# Patient Record
Sex: Female | Born: 1935 | Race: White | Hispanic: No | Marital: Married | Smoking: Never smoker
Health system: Southern US, Community
[De-identification: ages and names within clinical notes are randomized; demographics above are authoritative.]

## PROBLEM LIST (undated history)

## (undated) DIAGNOSIS — H9190 Unspecified hearing loss, unspecified ear: Secondary | ICD-10-CM

## (undated) DIAGNOSIS — M898X9 Other specified disorders of bone, unspecified site: Secondary | ICD-10-CM

## (undated) DIAGNOSIS — J449 Chronic obstructive pulmonary disease, unspecified: Secondary | ICD-10-CM

## (undated) DIAGNOSIS — E119 Type 2 diabetes mellitus without complications: Secondary | ICD-10-CM

## (undated) HISTORY — DX: Chronic obstructive pulmonary disease, unspecified: J44.9

## (undated) HISTORY — DX: Other specified disorders of bone, unspecified site: M89.8X9

## (undated) HISTORY — DX: Type 2 diabetes mellitus without complications: E11.9

## (undated) HISTORY — DX: Unspecified hearing loss, unspecified ear: H91.90

## (undated) HISTORY — PX: CARDIAC SURGERY: SHX584

## (undated) HISTORY — PX: VAGINAL HYSTERECTOMY: SUR661

## (undated) HISTORY — PX: GALLBLADDER SURGERY: SHX652

---

## 2004-10-06 ENCOUNTER — Ambulatory Visit: Payer: Self-pay | Admitting: Rheumatology

## 2005-04-12 ENCOUNTER — Ambulatory Visit: Payer: Self-pay | Admitting: Family Medicine

## 2005-04-18 ENCOUNTER — Ambulatory Visit: Payer: Self-pay | Admitting: Family Medicine

## 2005-08-22 ENCOUNTER — Ambulatory Visit: Payer: Self-pay | Admitting: Pain Medicine

## 2005-09-03 ENCOUNTER — Ambulatory Visit: Payer: Self-pay | Admitting: Pain Medicine

## 2005-09-04 ENCOUNTER — Ambulatory Visit: Payer: Self-pay | Admitting: Pain Medicine

## 2005-09-19 ENCOUNTER — Ambulatory Visit: Payer: Self-pay | Admitting: Physician Assistant

## 2005-10-02 ENCOUNTER — Ambulatory Visit: Payer: Self-pay | Admitting: Pain Medicine

## 2005-10-09 ENCOUNTER — Ambulatory Visit: Payer: Self-pay | Admitting: Family Medicine

## 2005-10-16 ENCOUNTER — Ambulatory Visit: Payer: Self-pay | Admitting: Pain Medicine

## 2005-10-23 ENCOUNTER — Ambulatory Visit: Payer: Self-pay | Admitting: Family Medicine

## 2005-10-30 ENCOUNTER — Ambulatory Visit: Payer: Self-pay | Admitting: Physician Assistant

## 2005-11-26 ENCOUNTER — Ambulatory Visit: Payer: Self-pay | Admitting: Physician Assistant

## 2005-11-27 ENCOUNTER — Ambulatory Visit: Payer: Self-pay | Admitting: Pain Medicine

## 2005-12-12 ENCOUNTER — Ambulatory Visit: Payer: Self-pay | Admitting: Physician Assistant

## 2006-01-07 ENCOUNTER — Ambulatory Visit: Payer: Self-pay | Admitting: Surgery

## 2006-01-07 ENCOUNTER — Other Ambulatory Visit: Payer: Self-pay

## 2006-01-15 ENCOUNTER — Inpatient Hospital Stay: Payer: Self-pay | Admitting: Surgery

## 2006-01-21 ENCOUNTER — Inpatient Hospital Stay: Payer: Self-pay | Admitting: Surgery

## 2006-03-22 ENCOUNTER — Emergency Department: Payer: Self-pay | Admitting: Unknown Physician Specialty

## 2006-03-22 ENCOUNTER — Other Ambulatory Visit: Payer: Self-pay

## 2006-04-30 ENCOUNTER — Ambulatory Visit: Payer: Self-pay | Admitting: Unknown Physician Specialty

## 2006-05-30 ENCOUNTER — Ambulatory Visit: Payer: Self-pay | Admitting: Unknown Physician Specialty

## 2007-06-23 ENCOUNTER — Ambulatory Visit: Payer: Self-pay | Admitting: Pain Medicine

## 2007-07-08 ENCOUNTER — Ambulatory Visit: Payer: Self-pay | Admitting: Pain Medicine

## 2007-07-21 ENCOUNTER — Other Ambulatory Visit: Payer: Self-pay

## 2007-07-21 ENCOUNTER — Emergency Department: Payer: Self-pay | Admitting: Emergency Medicine

## 2007-07-28 ENCOUNTER — Ambulatory Visit: Payer: Self-pay | Admitting: Physician Assistant

## 2008-03-11 ENCOUNTER — Ambulatory Visit: Payer: Self-pay | Admitting: Family Medicine

## 2008-12-17 ENCOUNTER — Ambulatory Visit: Payer: Self-pay | Admitting: Specialist

## 2009-08-24 ENCOUNTER — Ambulatory Visit: Payer: Self-pay | Admitting: Family Medicine

## 2010-02-22 ENCOUNTER — Ambulatory Visit: Payer: Self-pay | Admitting: Unknown Physician Specialty

## 2010-03-19 ENCOUNTER — Ambulatory Visit: Payer: Self-pay | Admitting: Unknown Physician Specialty

## 2010-08-21 ENCOUNTER — Ambulatory Visit: Payer: Self-pay | Admitting: Specialist

## 2011-10-24 ENCOUNTER — Ambulatory Visit: Payer: Self-pay | Admitting: Specialist

## 2011-12-25 ENCOUNTER — Emergency Department: Payer: Self-pay | Admitting: Emergency Medicine

## 2011-12-25 LAB — CBC
HCT: 35.9 % (ref 35.0–47.0)
HGB: 12.2 g/dL (ref 12.0–16.0)
MCH: 32.6 pg (ref 26.0–34.0)
MCHC: 34 g/dL (ref 32.0–36.0)
MCV: 96 fL (ref 80–100)
WBC: 16.2 10*3/uL — ABNORMAL HIGH (ref 3.6–11.0)

## 2011-12-25 LAB — COMPREHENSIVE METABOLIC PANEL
Bilirubin,Total: 0.4 mg/dL (ref 0.2–1.0)
Calcium, Total: 9.5 mg/dL (ref 8.5–10.1)
Chloride: 100 mmol/L (ref 98–107)
Co2: 24 mmol/L (ref 21–32)
Creatinine: 1.09 mg/dL (ref 0.60–1.30)
EGFR (African American): >60
EGFR (Non-African Amer.): 50 — ABNORMAL LOW
Osmolality: 282 (ref 275–301)
SGPT (ALT): 15 U/L
Sodium: 137 mmol/L (ref 136–145)

## 2011-12-25 LAB — URINALYSIS, COMPLETE
Bilirubin,UR: NEGATIVE
Nitrite: NEGATIVE
Ph: 5 (ref 4.5–8.0)
RBC,UR: 7 /HPF (ref 0–5)
Squamous Epithelial: 8
Transitional Epi: <1

## 2011-12-25 LAB — TROPONIN I: Troponin-I: <0.02 ng/mL

## 2013-04-02 ENCOUNTER — Encounter: Payer: Self-pay | Admitting: Cardiothoracic Surgery

## 2013-04-02 ENCOUNTER — Encounter: Payer: Self-pay | Admitting: Nurse Practitioner

## 2013-11-05 ENCOUNTER — Ambulatory Visit: Payer: Self-pay | Admitting: Podiatrist

## 2013-12-03 ENCOUNTER — Encounter: Payer: Self-pay | Admitting: Podiatrist

## 2013-12-03 ENCOUNTER — Ambulatory Visit (INDEPENDENT_AMBULATORY_CARE_PROVIDER_SITE_OTHER): Payer: 59 | Admitting: Podiatrist

## 2013-12-03 VITALS — BP 119/62 | HR 90 | Resp 16 | Ht 63.0 in | Wt 130.0 lb

## 2013-12-03 DIAGNOSIS — L603 Nail dystrophy: Secondary | ICD-10-CM

## 2013-12-03 DIAGNOSIS — L608 Other nail disorders: Secondary | ICD-10-CM

## 2013-12-03 NOTE — Patient Instructions (Signed)

## 2013-12-03 NOTE — Progress Notes (Signed)
Subjective: Joyce RhodesBetty presents today with her daughter for routine foot care. She denies any pain in her feet. She denies any problems or concerns with her feet at today's visit. Objective: Pedal pulses are palpable and strong at 2/4 DP bilateral and 1/4 PT bilateral. Capillary refill time is within normal limits, baseline multiple varicosities are present. Moderate swelling is present bilateral legs. Neurological sensation is intact via Semmes Weinstein monofilament at 5 out of 5 sites bilateral. Light touch is intact as well as vibratory sensation. Rectus foot type is seen with fat pad atrophy present bilateral. No open lesions, no interdigital maceration, no interdigital skin lesions are present. No pre-ulcerative or hyperkeratotic lesions are noted. The patient's toenails are elongated and and asymptomatic at today's visit.  Assessment: Elongated toenails bilateral feet Plan: Debridement of all 10 toenails was carried out at today's visit without complication. She'll be seen back in 3 months or per her request for continued care.

## 2014-03-04 ENCOUNTER — Ambulatory Visit (INDEPENDENT_AMBULATORY_CARE_PROVIDER_SITE_OTHER): Payer: 59 | Admitting: Podiatrist

## 2014-03-04 VITALS — Resp 16 | Ht 63.0 in | Wt 134.0 lb

## 2014-03-04 DIAGNOSIS — L600 Ingrowing nail: Secondary | ICD-10-CM

## 2014-03-04 DIAGNOSIS — M79609 Pain in unspecified limb: Secondary | ICD-10-CM

## 2014-03-04 DIAGNOSIS — L603 Nail dystrophy: Secondary | ICD-10-CM

## 2014-03-04 NOTE — Progress Notes (Signed)
Subjective: Joyce Tucker presents today with her daughter for routine foot care. She denies any pain in her feet. She has been swelling more and more in her legs recently. In the past they have tried compression hose however they are not comfortable. At this time she prefers elevation.  Objective: Pedal pulses are palpable and strong at 2/4 DP bilateral and 1/4 PT bilateral. Capillary refill time is within normal limits, baseline multiple varicosities are present. worsening swelling is present bilateral legs. Neurological sensation is intact via Semmes Weinstein monofilament at 5 out of 5 sites bilateral. Light touch is intact as well as vibratory sensation. Rectus foot type is seen with fat pad atrophy present bilateral. No open lesions, no interdigital maceration, no interdigital skin lesions are present. No pre-ulcerative or hyperkeratotic lesions are noted. The patient's toenails are elongated and and symptomatic at today's visit.  Assessment: Elongated toenails bilateral feet , symptomatic onychomycosis  Plan: Debridement of all 10 toenails was carried out at today's visit without complication. She'll be seen back in 3 months or per her request for continued care.

## 2014-06-10 ENCOUNTER — Ambulatory Visit (INDEPENDENT_AMBULATORY_CARE_PROVIDER_SITE_OTHER): Payer: 59 | Admitting: Podiatrist

## 2014-06-10 ENCOUNTER — Encounter: Payer: Self-pay | Admitting: Podiatrist

## 2014-06-10 DIAGNOSIS — M79609 Pain in unspecified limb: Secondary | ICD-10-CM

## 2014-06-10 DIAGNOSIS — M79676 Pain in unspecified toe(s): Principal | ICD-10-CM

## 2014-06-10 DIAGNOSIS — B351 Tinea unguium: Secondary | ICD-10-CM

## 2014-06-10 NOTE — Progress Notes (Signed)
Subjective: Joyce Tucker presents today with her daughter for routine foot care. She denies any pain in her feet. She has been swelling more and more in her legs recently. In the past they have tried compression hose however they are not comfortable. At this time she prefers elevation.  Objective: Pedal pulses are palpable and strong at 2/4 DP bilateral and 1/4 PT bilateral. Capillary refill time is within normal limits, baseline multiple varicosities are present. worsening swelling is present bilateral legs. Neurological sensation is intact via Semmes Weinstein monofilament at 5 out of 5 sites bilateral. Light touch is intact as well as vibratory sensation. Rectus foot type is seen with fat pad atrophy present bilateral. No open lesions, no interdigital maceration, no interdigital skin lesions are present. No pre-ulcerative or hyperkeratotic lesions are noted. The patient's toenails are elongated and and symptomatic at today's visit.  Assessment: Elongated toenails bilateral feet , symptomatic onychomycosis  Plan: Debridement of all 10 toenails was carried out at today's visit without complication. She'll be seen back in 3 months or per her request for continued care.        

## 2014-08-19 ENCOUNTER — Ambulatory Visit: Payer: Self-pay | Admitting: Internal Medicine

## 2014-09-03 ENCOUNTER — Emergency Department: Payer: Self-pay | Admitting: Emergency Medicine

## 2014-09-03 LAB — COMPREHENSIVE METABOLIC PANEL
ALBUMIN: 2.6 g/dL — AB (ref 3.4–5.0)
ALK PHOS: 87 U/L
Anion Gap: 13 (ref 7–16)
BILIRUBIN TOTAL: 0.6 mg/dL (ref 0.2–1.0)
BUN: 44 mg/dL — ABNORMAL HIGH (ref 7–18)
CALCIUM: 8.6 mg/dL (ref 8.5–10.1)
CHLORIDE: 99 mmol/L (ref 98–107)
Co2: 20 mmol/L — ABNORMAL LOW (ref 21–32)
Creatinine: 1.59 mg/dL — ABNORMAL HIGH (ref 0.60–1.30)
EGFR (African American): 39 — ABNORMAL LOW
EGFR (Non-African Amer.): 32 — ABNORMAL LOW
Glucose: 341 mg/dL — ABNORMAL HIGH (ref 65–99)
Osmolality: 289 (ref 275–301)
Potassium: 5.8 mmol/L — ABNORMAL HIGH (ref 3.5–5.1)
SGOT(AST): 38 U/L — ABNORMAL HIGH (ref 15–37)
SGPT (ALT): 41 U/L
Sodium: 132 mmol/L — ABNORMAL LOW (ref 136–145)
Total Protein: 6.9 g/dL (ref 6.4–8.2)

## 2014-09-03 LAB — CBC WITH DIFFERENTIAL/PLATELET
BASOS PCT: 0.3 %
Basophil #: 0 10*3/uL (ref 0.0–0.1)
EOS PCT: 0 %
Eosinophil #: 0 10*3/uL (ref 0.0–0.7)
HCT: 39.6 % (ref 35.0–47.0)
HGB: 12.3 g/dL (ref 12.0–16.0)
LYMPHS ABS: 1.4 10*3/uL (ref 1.0–3.6)
LYMPHS PCT: 10 %
MCH: 31.9 pg (ref 26.0–34.0)
MCHC: 31.2 g/dL — ABNORMAL LOW (ref 32.0–36.0)
MCV: 102 fL — ABNORMAL HIGH (ref 80–100)
Monocyte #: 0.9 x10 3/mm (ref 0.2–0.9)
Monocyte %: 6.3 %
NEUTROS ABS: 11.5 10*3/uL — AB (ref 1.4–6.5)
Neutrophil %: 83.4 %
Platelet: 328 10*3/uL (ref 150–440)
RBC: 3.87 10*6/uL (ref 3.80–5.20)
RDW: 16.4 % — AB (ref 11.5–14.5)
WBC: 13.8 10*3/uL — AB (ref 3.6–11.0)

## 2014-09-03 LAB — PROTIME-INR
INR: 1.3
Prothrombin Time: 15.5 secs — ABNORMAL HIGH (ref 11.5–14.7)

## 2014-09-03 LAB — TROPONIN I: Troponin-I: 0.03 ng/mL

## 2014-09-03 LAB — PRO B NATRIURETIC PEPTIDE: B-Type Natriuretic Peptide: 75315 pg/mL — ABNORMAL HIGH (ref 0–450)

## 2014-09-03 LAB — CK TOTAL AND CKMB (NOT AT ARMC)
CK, Total: 74 U/L
CK-MB: 1.2 ng/mL (ref 0.5–3.6)

## 2014-09-09 ENCOUNTER — Ambulatory Visit: Payer: 59 | Admitting: Podiatrist

## 2014-09-09 ENCOUNTER — Ambulatory Visit: Payer: 59 | Admitting: Podiatry

## 2014-09-19 ENCOUNTER — Ambulatory Visit: Payer: Self-pay | Admitting: Internal Medicine

## 2014-09-19 DEATH — deceased

## 2015-05-22 IMAGING — CR DG CHEST 1V PORT
1 series · 1 of 1 positions shown · non-contrast
Comparison: One-view chest 12/25/2011.

CLINICAL DATA: Shortness of breath and rapid heart rate. Supra
ventricular tachycardia treated with the date is seen. Chest pain.

EXAM:
PORTABLE CHEST - 1 VIEW

[ap]
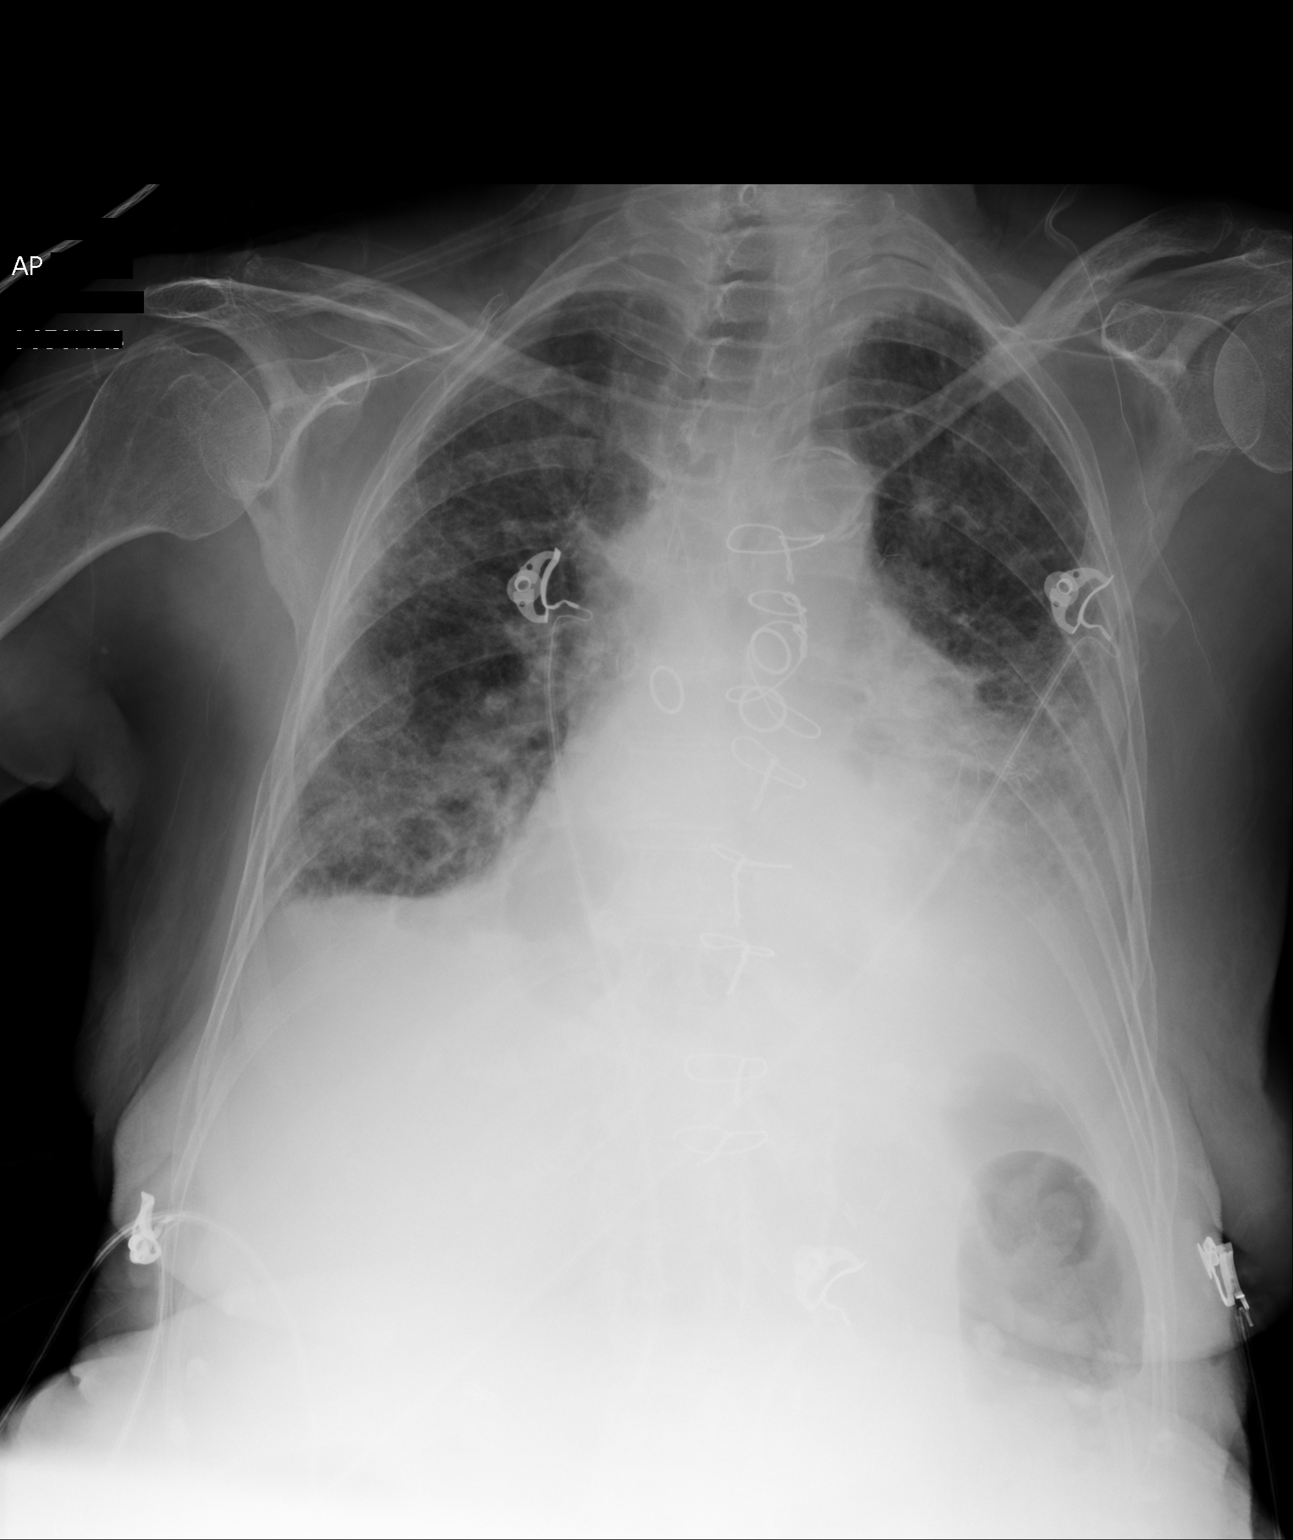

[1 of 1 positions shown; findings below may reference images not displayed]

FINDINGS: The heart is enlarged. The patient is status post median sternotomy
for CABG. Atherosclerotic calcifications are present at the arch.
Bilateral pleural effusions are worse on the left. Left basilar
airspace disease is evident. Mild edema is present.
IMPRESSION: 1. Cardiomegaly with mild edema and bilateral effusions, left
greater than right. This is compatible with congestive heart
failure.
2. Left lower lobe airspace disease could represent atelectasis, but
is most compatible with pneumonia.
